# Patient Record
Sex: Male | Born: 1995 | Race: White | Hispanic: No | Marital: Single | Smoking: Never smoker
Health system: Southern US, Community
[De-identification: ages and names within clinical notes are randomized; demographics above are authoritative.]

---
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
## 2017-11-10 ENCOUNTER — Telehealth: Payer: Self-pay | Admitting: Gastroenterology

## 2017-11-10 ENCOUNTER — Emergency Department: Payer: BLUE CROSS/BLUE SHIELD | Admitting: Anesthesiology

## 2017-11-10 ENCOUNTER — Other Ambulatory Visit: Payer: Self-pay

## 2017-11-10 ENCOUNTER — Emergency Department
Admission: EM | Admit: 2017-11-10 | Discharge: 2017-11-10 | Disposition: A | Discharge status: Home / Self Care | Payer: BLUE CROSS/BLUE SHIELD | Attending: Emergency Medicine | Admitting: Emergency Medicine

## 2017-11-10 ENCOUNTER — Encounter
Admission: EM | Disposition: A | Discharge status: Home / Self Care | Payer: Self-pay | Source: Home / Self Care | Attending: Emergency Medicine

## 2017-11-10 ENCOUNTER — Encounter: Payer: Self-pay | Admitting: Emergency Medicine

## 2017-11-10 DIAGNOSIS — R1314 Dysphagia, pharyngoesophageal phase: Secondary | ICD-10-CM | POA: Insufficient documentation

## 2017-11-10 DIAGNOSIS — X58XXXA Exposure to other specified factors, initial encounter: Secondary | ICD-10-CM | POA: Insufficient documentation

## 2017-11-10 DIAGNOSIS — K228 Other specified diseases of esophagus: Secondary | ICD-10-CM | POA: Diagnosis not present

## 2017-11-10 DIAGNOSIS — K2210 Ulcer of esophagus without bleeding: Secondary | ICD-10-CM | POA: Insufficient documentation

## 2017-11-10 DIAGNOSIS — K222 Esophageal obstruction: Secondary | ICD-10-CM | POA: Diagnosis not present

## 2017-11-10 DIAGNOSIS — T18128A Food in esophagus causing other injury, initial encounter: Secondary | ICD-10-CM | POA: Diagnosis present

## 2017-11-10 HISTORY — PX: ESOPHAGOGASTRODUODENOSCOPY (EGD) WITH PROPOFOL: SHX5813

## 2017-11-10 SURGERY — ESOPHAGOGASTRODUODENOSCOPY (EGD) WITH PROPOFOL
Anesthesia: General

## 2017-11-10 MED ORDER — MIDAZOLAM HCL 2 MG/2ML IJ SOLN
INTRAMUSCULAR | Status: AC
  Filled 2017-11-10: qty 2

## 2017-11-10 MED ORDER — NITROGLYCERIN 0.4 MG SL SUBL
0.4000 mg | SUBLINGUAL_TABLET | Freq: Once | SUBLINGUAL | Status: AC
  Administered 2017-11-10: 0.4 mg via SUBLINGUAL
  Filled 2017-11-10: qty 1

## 2017-11-10 MED ORDER — MIDAZOLAM HCL 2 MG/2ML IJ SOLN
INTRAMUSCULAR | Status: DC | PRN
  Administered 2017-11-10: 2 mg via INTRAVENOUS

## 2017-11-10 MED ORDER — DEXAMETHASONE SODIUM PHOSPHATE 10 MG/ML IJ SOLN
INTRAMUSCULAR | Status: DC | PRN
  Administered 2017-11-10: 10 mg via INTRAVENOUS

## 2017-11-10 MED ORDER — GLUCAGON HCL RDNA (DIAGNOSTIC) 1 MG IJ SOLR
1.0000 mg | Freq: Once | INTRAMUSCULAR | Status: AC
  Administered 2017-11-10: 1 mg via INTRAVENOUS
  Filled 2017-11-10: qty 1

## 2017-11-10 MED ORDER — HYDROCODONE-ACETAMINOPHEN 7.5-325 MG PO TABS
1.0000 | ORAL_TABLET | Freq: Once | ORAL | Status: DC | PRN
  Filled 2017-11-10: qty 1

## 2017-11-10 MED ORDER — MEPERIDINE HCL 25 MG/ML IJ SOLN
6.2500 mg | INTRAMUSCULAR | Status: DC | PRN
  Filled 2017-11-10: qty 1

## 2017-11-10 MED ORDER — FENTANYL CITRATE (PF) 100 MCG/2ML IJ SOLN: 25.0000 ug | INTRAMUSCULAR | Status: DC | PRN

## 2017-11-10 MED ORDER — PROPOFOL 10 MG/ML IV BOLUS
INTRAVENOUS | Status: DC | PRN
  Administered 2017-11-10: 200 mg via INTRAVENOUS

## 2017-11-10 MED ORDER — ACETAMINOPHEN 325 MG PO TABS: 325.0000 mg | ORAL_TABLET | ORAL | Status: DC | PRN

## 2017-11-10 MED ORDER — PROMETHAZINE HCL 25 MG/ML IJ SOLN: 6.2500 mg | INTRAMUSCULAR | Status: DC | PRN

## 2017-11-10 MED ORDER — GLYCOPYRROLATE 0.2 MG/ML IJ SOLN
INTRAMUSCULAR | Status: AC
  Filled 2017-11-10: qty 1

## 2017-11-10 MED ORDER — ONDANSETRON HCL 4 MG/2ML IJ SOLN
INTRAMUSCULAR | Status: AC
  Filled 2017-11-10: qty 2

## 2017-11-10 MED ORDER — DEXAMETHASONE SODIUM PHOSPHATE 10 MG/ML IJ SOLN
INTRAMUSCULAR | Status: AC
  Filled 2017-11-10: qty 1

## 2017-11-10 MED ORDER — OMEPRAZOLE 40 MG PO CPDR: 40.0000 mg | DELAYED_RELEASE_CAPSULE | Freq: Two times a day (BID) | ORAL | 1 refills | Status: AC

## 2017-11-10 MED ORDER — SUCCINYLCHOLINE CHLORIDE 20 MG/ML IJ SOLN
INTRAMUSCULAR | Status: DC | PRN
  Administered 2017-11-10: 100 mg via INTRAVENOUS

## 2017-11-10 MED ORDER — FENTANYL CITRATE (PF) 100 MCG/2ML IJ SOLN
INTRAMUSCULAR | Status: AC
  Filled 2017-11-10: qty 2

## 2017-11-10 MED ORDER — ONDANSETRON HCL 4 MG/2ML IJ SOLN
INTRAMUSCULAR | Status: DC | PRN
  Administered 2017-11-10: 4 mg via INTRAVENOUS

## 2017-11-10 MED ORDER — LIDOCAINE HCL (CARDIAC) PF 100 MG/5ML IV SOSY
PREFILLED_SYRINGE | INTRAVENOUS | Status: DC | PRN
  Administered 2017-11-10: 25 mg via INTRAVENOUS

## 2017-11-10 MED ORDER — LIDOCAINE HCL (PF) 2 % IJ SOLN
INTRAMUSCULAR | Status: AC
  Filled 2017-11-10: qty 10

## 2017-11-10 MED ORDER — PROPOFOL 10 MG/ML IV BOLUS
INTRAVENOUS | Status: AC
  Filled 2017-11-10: qty 20

## 2017-11-10 MED ORDER — GLYCOPYRROLATE 0.2 MG/ML IJ SOLN
INTRAMUSCULAR | Status: DC | PRN
  Administered 2017-11-10: 0.2 mg via INTRAVENOUS

## 2017-11-10 MED ORDER — SODIUM CHLORIDE 0.9 % IV SOLN
INTRAVENOUS | Status: DC | PRN
  Administered 2017-11-10: 14:00:00 via INTRAVENOUS

## 2017-11-10 MED ORDER — FENTANYL CITRATE (PF) 100 MCG/2ML IJ SOLN
INTRAMUSCULAR | Status: DC | PRN
  Administered 2017-11-10: 100 ug via INTRAVENOUS

## 2017-11-10 MED ORDER — SUCCINYLCHOLINE CHLORIDE 20 MG/ML IJ SOLN
INTRAMUSCULAR | Status: AC
  Filled 2017-11-10: qty 1

## 2017-11-10 MED ORDER — ACETAMINOPHEN 160 MG/5ML PO SOLN
325.0000 mg | ORAL | Status: DC | PRN
  Filled 2017-11-10: qty 20.3

## 2017-11-10 NOTE — Anesthesia Procedure Notes (Signed)
Procedure Name: Intubation Performed by: Rolla Plate, CRNA Pre-anesthesia Checklist: Patient identified, Patient being monitored, Timeout performed, Emergency Drugs available and Suction available Patient Re-evaluated:Patient Re-evaluated prior to induction Oxygen Delivery Method: Circle system utilized Preoxygenation: Pre-oxygenation with 100% oxygen Induction Type: IV induction and Rapid sequence Ventilation: Mask ventilation without difficulty Laryngoscope Size: Mac and 3 Grade View: Grade I Tube type: Oral Tube size: 7.0 mm Number of attempts: 1 Airway Equipment and Method: Stylet Placement Confirmation: ETT inserted through vocal cords under direct vision,  positive ETCO2 and breath sounds checked- equal and bilateral Secured at: 22 cm Tube secured with: Tape Dental Injury: Teeth and Oropharynx as per pre-operative assessment

## 2017-11-10 NOTE — Consult Note (Signed)
Arlyss Repress, MD [address]  [address]  [place], [place] [address]  Main: [phone]  Fax: [phone] Pager: [phone]   Consultation  Referring Provider:     No ref. provider found Primary Care Physician:  Patient, No Pcp Per Primary Gastroenterologist:  Dr. Lannette Donath         Reason for Consultation:     Food bolus impaction  Date of Admission:  11/10/2017 Date of Consultation:  11/10/2017         HPI:   Ian Neal is a 22 y.o. male with history of intermittent dysphagia to solids, presents with impaction of food bolus after eating a beef jerky during lunch yesterday. He said he has been tried to get it down for 16 hours. He is unable to swallow his own saliva. He reports several years history of intermittent dysphagia to solids which would last for seconds and then it would pass. He is not on PPI   NSAIDs: none  Antiplts/Anticoagulants/Anti thrombotics: none  GI Procedures: none  History reviewed. No pertinent past medical history.  History reviewed. No pertinent surgical history.  Prior to Admission medications   Not on File    History reviewed. No pertinent family history.   Social History   Tobacco Use  . Smoking status: Never Smoker  . Smokeless tobacco: Never Used  Substance Use Topics  . Alcohol use: Never    Frequency: Never  . Drug use: Never    Allergies as of 11/10/2017  . (No Known Allergies)    Review of Systems:    All systems reviewed and negative except where noted in HPI.   Physical Exam:  Vital signs in last 24 hours: Temp:  [98.9 F (37.2 C)] 98.9 F (37.2 C) (09/09 0705) Pulse Rate:  [80-91] 87 (09/09 1130) Resp:  [16-17] 16 (09/09 1130) BP: (120-148)/(75-93) 123/75 (09/09 1130) SpO2:  [98 %-99 %] 99 % (09/09 1130) Weight:  [77.1 kg] 77.1 kg (09/09 0703)   General:   Pleasant, cooperative in NAD Head:  Normocephalic and atraumatic. Eyes:   No icterus.   Conjunctiva pink. PERRLA. Ears:  Normal auditory  acuity. Neck:  Supple; no masses or thyroidomegaly Lungs: Respirations even and unlabored. Lungs clear to auscultation bilaterally.   No wheezes, crackles, or rhonchi.  Heart:  Regular rate and rhythm;  Without murmur, clicks, rubs or gallops Abdomen:  Soft, nondistended, nontender. Normal bowel sounds. No appreciable masses or hepatomegaly.  No rebound or guarding.  Rectal:  Not performed. Msk:  Symmetrical without gross deformities.  Strength normal Extremities:  Without edema, cyanosis or clubbing. Neurologic:  Alert and oriented x3;  grossly normal neurologically. Skin:  Intact without significant lesions or rashes. Cervical Nodes:  No significant cervical adenopathy. Psych:  Alert and cooperative. Normal affect.  LAB RESULTS: No flowsheet data found.  BMET No flowsheet data found.  LFT No flowsheet data found.   STUDIES: No results found.    Impression / Plan:   Ian Neal is a 22 y.o. male with history of chronic intermittent dysphagia to solids, presented to the ER with food bolus impaction He may have eosinophilic esophagitis based on the history  Strict nothing by mouth Urgent EGD  I have discussed alternative options, risks & benefits,  which include, but are not limited to, bleeding, infection, perforation,respiratory complication & drug reaction.  The patient agrees with this plan & written consent will be obtained.    Thank you for involving me  in the care of this patient.      LOS: 0 days   Lannette Donath, MD  11/10/2017, 12:55 PM   Note: This dictation was prepared with Dragon dictation along with smaller phrase technology. Any transcriptional errors that result from this process are unintentional.

## 2017-11-10 NOTE — ED Notes (Signed)
Report given to endoscopy 

## 2017-11-10 NOTE — ED Provider Notes (Signed)
[facility] Emergency Department Provider Note  Time seen: 7:26 AM  I have reviewed the triage vital signs and the nursing notes.   HISTORY  Chief Complaint Foreign Body    HPI Ian Neal is a 22 y.o. male with no past medical history presents to the emergency department unable to swallow.  According to the patient around lunchtime yesterday he was eating beef jerky when he felt like it got stuck in his esophagus.  He states over the past several years he has intermittently felt like food is gotten stuck but it lasted only seconds and then it passed into his stomach.  He states since lunchtime yesterday he has been unable to swallow even his own secretions having to spit out saliva.  Denies any history of heartburn, denies any medical problems does not take pills or medications for any reason.  Has never had an endoscopy.   History reviewed. No pertinent past medical history.  There are no active problems to display for this patient.   History reviewed. No pertinent surgical history.  Prior to Admission medications   Not on File    No Known Allergies  History reviewed. No pertinent family history.  Social History Social History   Tobacco Use  . Smoking status: Never Smoker  . Smokeless tobacco: Never Used  Substance Use Topics  . Alcohol use: Never    Frequency: Never  . Drug use: Never    Review of Systems Constitutional: Negative for fever. Cardiovascular: Negative for chest pain. Respiratory: Negative for shortness of breath. Gastrointestinal: Negative for abdominal pain.  Try to make himself vomit but could not All other ROS negative  ____________________________________________   PHYSICAL EXAM:  VITAL SIGNS: ED Triage Vitals  Enc Vitals Group     BP 11/10/17 0704 (!) 148/93     Pulse Rate 11/10/17 0704 85     Resp 11/10/17 0704 16     Temp 11/10/17 0705 98.9 F (37.2 C)     Temp Source 11/10/17 0705 Oral     SpO2 11/10/17  0704 98 %     Weight 11/10/17 0703 170 lb (77.1 kg)     Height 11/10/17 0703 5\' 7"  (1.702 m)     Head Circumference --      Peak Flow --      Pain Score 11/10/17 0703 0     Pain Loc --      Pain Edu? --      Excl. in GC? --     Constitutional: Alert and oriented. Well appearing and in no distress. Eyes: Normal exam ENT   Head: Normocephalic and atraumatic.   Mouth/Throat: Mucous membranes are moist. Cardiovascular: Normal rate, regular rhythm.  Respiratory: Normal respiratory effort without tachypnea nor retractions. Breath sounds are clear Gastrointestinal: Soft and nontender. No distention.   Musculoskeletal: Nontender with normal range of motion in all extremities.  Neurologic:  Normal speech and language. No gross focal neurologic deficits  Skin:  Skin is warm, dry and intact.  Psychiatric: Mood and affect are normal.  ____________________________________________    EKG  EKG reviewed and interpreted by myself shows sinus rhythm at 75 bpm with a narrow QRS, normal axis, normal intervals, no ST changes.  ____________________________________________   INITIAL IMPRESSION / ASSESSMENT AND PLAN / ED COURSE  Pertinent labs & imaging results that were available during my care of the patient were reviewed by me and considered in my medical decision making (see chart for details).  Patient presents  to the emergency department with likely esophageal food bolus impaction.  Differential would include Schatzki ring, dysmotility, food bolus sensation.  We will attempt to have the patient drink warm carbonated soda after dosing 1 tablet of nitroglycerin.  Patient agreeable to plan of care.  Failed nitroglycerin, failed carbonated beverage, failed glucagon.  Discussed with Dr. Allegra Lai of GI medicine, she will be down to see the patient for likely endoscopy.  ____________________________________________   FINAL CLINICAL IMPRESSION(S) / ED DIAGNOSES  Esophageal food bolus  impaction    Minna Antis, MD 11/10/17 [phone]

## 2017-11-10 NOTE — Op Note (Signed)
[facility] Gastroenterology Patient Name: Ian Neal Procedure Date: 11/10/2017 2:28 PM MRN: [id number] Account #: [id number] Date of Birth: Sep 19, 1995 Admit Type: Outpatient Age: 22 Room: [facility] ENDO ROOM 2 Gender: Male Note Status: Finalized Procedure:            Upper GI endoscopy Indications:          Esophageal dysphagia, Foreign body in the esophagus Providers:            Toney Reil MD, MD Referring MD:         No Local Md, MD (Referring MD) Medicines:            General Anesthesia Complications:        No immediate complications. Estimated blood loss: None. Procedure:            Pre-Anesthesia Assessment:                       - Prior to the procedure, a History and Physical was                        performed, and patient medications and allergies were                        reviewed. The patient is competent. The risks and                        benefits of the procedure and the sedation options and                        risks were discussed with the patient. All questions                        were answered and informed consent was obtained.                        Patient identification and proposed procedure were                        verified by the physician, the nurse, the                        anesthesiologist, the anesthetist and the technician in                        the pre-procedure area in the procedure room in the                        endoscopy suite. Mental Status Examination: alert and                        oriented. Airway Examination: normal oropharyngeal                        airway and neck mobility. Respiratory Examination:                        clear to auscultation. CV Examination: normal.                        Prophylactic Antibiotics: The patient does not require  prophylactic antibiotics. Prior Anticoagulants: The                        patient has taken no previous anticoagulant or                   antiplatelet agents. ASA Grade Assessment: II - A                        patient with mild systemic disease. After reviewing the                        risks and benefits, the patient was deemed in                        satisfactory condition to undergo the procedure. The                        anesthesia plan was to use general anesthesia.                        Immediately prior to administration of medications, the                        patient was re-assessed for adequacy to receive                        sedatives. The heart rate, respiratory rate, oxygen                        saturations, blood pressure, adequacy of pulmonary                        ventilation, and response to care were monitored                        throughout the procedure. The physical status of the                        patient was re-assessed after the procedure.                       After obtaining informed consent, the endoscope was                        passed under direct vision. Throughout the procedure,                        the patient's blood pressure, pulse, and oxygen                        saturations were monitored continuously. The Endoscope                        was introduced through the mouth, and advanced to the                        second part of duodenum. The upper GI endoscopy was                        accomplished without difficulty.  The patient tolerated                        the procedure well. Findings:      Food was found in the upper third of the esophagus. Removal of food was       accomplished.      Mucosal changes including circumferential folds and crepe paper       esophagus were found in the entire esophagus. Esophageal findings were       graded using the Eosinophilic Esophagitis Endoscopic Reference Score       (EoE-EREFS) as: Edema Grade 1 Present (decreased clarity or absence of       vascular markings), Rings Grade 2 Moderate (distinct rings that  do not       occlude passage of diagnostic 8-10 mm endoscope), Exudates Grade 2       Severe (scattered white lesions involving 10 percent or greater of the       esophageal surface area), Furrows Grade 0 None (no vertical lines seen)       and Stricture none (no stricture found). Biopsies were obtained from the       proximal and distal esophagus with cold forceps for histology of       suspected eosinophilic esophagitis.      The entire examined stomach was normal.      The duodenal bulb and second portion of the duodenum were normal. Impression:           - Food in the upper third of the esophagus. Removal was                        successful.                       - Esophageal mucosal changes suggestive of eosinophilic                        esophagitis. Biopsied.                       - Normal stomach.                       - Normal duodenal bulb and second portion of the                        duodenum. Recommendation:       - Discharge patient to home (with escort).                       - Chopped diet and soft diet.                       - Continue present medications.                       - Await pathology results.                       - Use Prilosec (omeprazole) 40 mg PO BID for 3 months.                       - Return to my office in 4 weeks. Procedure Code(s):    --- Professional ---  [id number], Esophagogastroduodenoscopy, flexible, transoral;                        with removal of foreign body(s)                       43239, Esophagogastroduodenoscopy, flexible, transoral;                        with biopsy, single or multiple Diagnosis Code(s):    --- Professional ---                       U04.540J, Food in esophagus causing other injury,                        initial encounter                       K22.8, Other specified diseases of esophagus                       R13.14, Dysphagia, pharyngoesophageal phase                       T18.108A,  Unspecified foreign body in esophagus causing                        other injury, initial encounter CPT copyright 2017 American Medical Association. All rights reserved. The codes documented in this report are preliminary and upon coder review may  be revised to meet current compliance requirements. Dr. Libby Maw Toney Reil MD, MD 11/10/2017 2:53:51 PM This report has been signed electronically. Number of Addenda: 0 Note Initiated On: 11/10/2017 2:28 PM      [facility]

## 2017-11-10 NOTE — ED Triage Notes (Signed)
Got piece beef jerky stuck in esophagus last night. Feels discomfort. Swallowing secretions, no dyspnea

## 2017-11-10 NOTE — Anesthesia Preprocedure Evaluation (Signed)
Anesthesia Evaluation  Patient identified by MRN, date of birth, ID band Patient awake    Reviewed: Allergy & Precautions, H&P , NPO status , Patient's Chart, lab work & pertinent test results, reviewed documented beta blocker date and time   Airway Mallampati: II  TM Distance: >3 FB Neck ROM: full    Dental  (+) Teeth Intact   Pulmonary neg pulmonary ROS,    Pulmonary exam normal        Cardiovascular negative cardio ROS Normal cardiovascular exam Rhythm:regular Rate:Normal     Neuro/Psych negative neurological ROS  negative psych ROS   GI/Hepatic negative GI ROS, Neg liver ROS,   Endo/Other  negative endocrine ROS  Renal/GU negative Renal ROS  negative genitourinary   Musculoskeletal   Abdominal   Peds  Hematology negative hematology ROS (+)   Anesthesia Other Findings History reviewed. No pertinent past medical history. History reviewed. No pertinent surgical history. BMI    Body Mass Index:  26.63 kg/m     Reproductive/Obstetrics negative OB ROS                             Anesthesia Physical Anesthesia Plan  ASA: II and emergent  Anesthesia Plan: General ETT   Post-op Pain Management:    Induction:   PONV Risk Score and Plan:   Airway Management Planned:   Additional Equipment:   Intra-op Plan:   Post-operative Plan:   Informed Consent: I have reviewed the patients History and Physical, chart, labs and discussed the procedure including the risks, benefits and alternatives for the proposed anesthesia with the patient or authorized representative who has indicated his/her understanding and acceptance.   Dental Advisory Given  Plan Discussed with: CRNA  Anesthesia Plan Comments:         Anesthesia Quick Evaluation

## 2017-11-10 NOTE — ED Notes (Signed)
Per verbal order from [facility] MD, patient attempted to drink water after glucagon administration. Patient unsuccessful. MD notified and aware.

## 2017-11-10 NOTE — Telephone Encounter (Signed)
Left vm for pt to call office and schedule apt  °

## 2017-11-10 NOTE — Telephone Encounter (Signed)
-----   Message from Toney Reil, MD sent at 11/10/2017  3:00 PM EDT ----- Regarding: New pt He needs f/u in 4weeks  Dx: EoE  Thanks RV

## 2017-11-10 NOTE — ED Notes (Signed)
Patient is to go to endo suite per [facility] MD. Patient instructed to change into gown. Awaiting GI MD arrival.

## 2017-11-10 NOTE — ED Notes (Signed)
Patient and family updated on plan of care and that we are waiting to hear back from GI. Denies needs at this time. Will continue to monitor.

## 2017-11-10 NOTE — ED Notes (Signed)
Per verbal order from [facility] patient attempted to drink shasta cola drink. Patient is unable to swallow drink. Airway is patent. Patient is able to control his secretions at this time. MD notified.

## 2017-11-10 NOTE — Transfer of Care (Signed)
Immediate Anesthesia Transfer of Care Note  Patient: Ian Neal  Procedure(s) Performed: ESOPHAGOGASTRODUODENOSCOPY (EGD) WITH PROPOFOL (N/A )  Patient Location: PACU  Anesthesia Type:General  Level of Consciousness: sedated  Airway & Oxygen Therapy: Patient Spontanous Breathing and Patient connected to nasal cannula oxygen  Post-op Assessment: Report given to RN and Post -op Vital signs reviewed and stable  Post vital signs: Reviewed  Last Vitals:  Vitals Value Taken Time  BP 131/88 11/10/2017  2:57 PM  Temp    Pulse 122 11/10/2017  2:57 PM  Resp 15 11/10/2017  2:57 PM  SpO2 100 % 11/10/2017  2:57 PM  Vitals shown include unvalidated device data.  Last Pain:  Vitals:   11/10/17 0705  TempSrc: Oral  PainSc:          Complications: No apparent anesthesia complications

## 2017-11-10 NOTE — ED Notes (Signed)
First Nurse note: Patient states he has beef jerky stuck in his throat. Has been trying to get it to go down for 16 hours. Points to the middle of his chest and states when he drinks water to get it to go down it just comes back up. Unable to swallow his own saliva although patient is not seen having to spit during his time at the registration desk or while waiting for triage.

## 2017-11-10 NOTE — Anesthesia Post-op Follow-up Note (Signed)
Anesthesia QCDR form completed.        

## 2017-11-11 ENCOUNTER — Encounter: Payer: Self-pay | Admitting: Gastroenterology

## 2017-11-11 NOTE — Anesthesia Postprocedure Evaluation (Signed)
Anesthesia Post Note  Patient: Ian Neal  Procedure(s) Performed: ESOPHAGOGASTRODUODENOSCOPY (EGD) WITH PROPOFOL (N/A )  Patient location during evaluation: PACU Anesthesia Type: General Level of consciousness: awake and alert Pain management: pain level controlled Vital Signs Assessment: post-procedure vital signs reviewed and stable Respiratory status: spontaneous breathing, nonlabored ventilation and respiratory function stable Cardiovascular status: blood pressure returned to baseline and stable Postop Assessment: no apparent nausea or vomiting Anesthetic complications: no     Last Vitals:  Vitals:   11/10/17 1525 11/10/17 1600  BP: 135/86 121/78  Pulse: (!) 105 (!) 101  Resp: 16 16  Temp: (!) 36.4 C 36.9 C  SpO2: 100% 100%    Last Pain:  Vitals:   11/10/17 1600  TempSrc:   PainSc: 3                  Kosta Schnitzler Garry Heater

## 2017-11-12 LAB — SURGICAL PATHOLOGY

## 2017-12-21 MED ORDER — KETOROLAC TROMETHAMINE 30 MG/ML IJ SOLN
INTRAMUSCULAR | Status: AC
  Filled 2017-12-21: qty 1

## 2017-12-22 ENCOUNTER — Encounter: Payer: Self-pay | Admitting: Gastroenterology

## 2017-12-22 ENCOUNTER — Ambulatory Visit: Payer: BLUE CROSS/BLUE SHIELD | Admitting: Gastroenterology

## 2017-12-22 DIAGNOSIS — K222 Esophageal obstruction: Secondary | ICD-10-CM

## 2023-06-03 DEATH — deceased
